# Patient Record
Sex: Female | Born: 1965 | Race: Black or African American | Hispanic: No | Smoking: Current some day smoker
Health system: Southern US, Community
[De-identification: ages and names within clinical notes are randomized; demographics above are authoritative.]

## PROBLEM LIST (undated history)

## (undated) HISTORY — PX: NO PAST SURGERIES: SHX2092

---

## 2016-10-28 ENCOUNTER — Ambulatory Visit
Admission: EM | Admit: 2016-10-28 | Discharge: 2016-10-28 | Disposition: A | Discharge status: Home / Self Care | Payer: 59 | Attending: Emergency Medicine | Admitting: Emergency Medicine

## 2016-10-28 DIAGNOSIS — B029 Zoster without complications: Secondary | ICD-10-CM

## 2016-10-28 MED ORDER — VALACYCLOVIR HCL 1 G PO TABS: 1000.0000 mg | ORAL_TABLET | Freq: Three times a day (TID) | ORAL | 0 refills | Status: AC

## 2016-10-28 MED ORDER — PREDNISONE 10 MG PO TABS: ORAL_TABLET | ORAL | 0 refills | Status: DC

## 2016-10-28 NOTE — ED Triage Notes (Signed)
Patient complains of rash that started Friday on abdomen. Patient states that the area has been stinging and itching.

## 2016-10-28 NOTE — ED Provider Notes (Signed)
[facility] ____________________________________________  Time seen: Approximately 8:51 PM  I have reviewed the triage vital signs and the nursing notes.   HISTORY  Chief Complaint Rash   HPI Wisdom Seybold is a 51 y.o. female presenting for complaints of rash to abdomen that has been present for 2.5 days. Patient reports did notice a slight stinging sensation to same area the day before rash onset and then rash appeared. Patient describes rash as a mild stinging and burning area occasionally itchy and tender. Denies persistent pain. Denies known trigger. Denies insect bite, tick bite or tick attachment. Denies cut, trauma or known break in skin. Denies any other rash or skin changes. Denies fevers. Denies recent sickness. Reports otherwise feels well. Denies history of similar in the past. Reports did have chickenpox as a child. Has not received the shingles vaccine.  Denies chest pain, shortness of breath, abdominal pain, dysuria, sore throat, cough, congestion, fevers, extremity pain, extremity swelling or rash. Denies recent sickness. Denies recent antibiotic use. Denies cardiac history. Denies renal insufficiency history. Denies diabetes.   History reviewed. No pertinent past medical history. Denies There are no active problems to display for this patient.   Past Surgical History:  Procedure Laterality Date  . NO PAST SURGERIES       No current facility-administered medications for this encounter.   Current Outpatient Prescriptions:  .  predniSONE (DELTASONE) 10 MG tablet, Start 60 mg po day one, then 50 mg po day two, taper by 10 mg daily until complete., Disp: 21 tablet, Rfl: 0 .  valACYclovir (VALTREX) 1000 MG tablet, Take 1 tablet (1,000 mg total) by mouth 3 (three) times daily., Disp: 21 tablet, Rfl: 0  Allergies Patient has no known allergies.   family history Reports father had shingles on his chest many years ago.  Social History Social History    Substance Use Topics  . Smoking status: Current Some Day Smoker  . Smokeless tobacco: Never Used  . Alcohol use Yes     Comment: occasionally    Review of Systems Constitutional: No fever/chills Eyes: No visual changes. ENT: No sore throat. Cardiovascular: Denies chest pain. Respiratory: Denies shortness of breath. Gastrointestinal: No abdominal pain.  No nausea, no vomiting.  No diarrhea.  No constipation. Genitourinary: Negative for dysuria. Musculoskeletal: Negative for back pain. Skin: Positive for rash. Neurological: Negative for headaches, focal weakness or numbness.  10-point ROS otherwise negative.  ____________________________________________   PHYSICAL EXAM:  VITAL SIGNS: ED Triage Vitals  Enc Vitals Group     BP 10/28/16 2000 132/76     Pulse Rate 10/28/16 2000 76     Resp 10/28/16 2000 18     Temp 10/28/16 2000 97.6 F (36.4 C)     Temp Source 10/28/16 2000 Oral     SpO2 10/28/16 2000 100 %     Weight 10/28/16 1958 130 lb (59 kg)     Height 10/28/16 1958 5\' 5"  (1.651 m)     Head Circumference --      Peak Flow --      Pain Score 10/28/16 1959 2     Pain Loc --      Pain Edu? --      Excl. in GC? --     Constitutional: Alert and oriented. Well appearing and in no acute distress. Eyes: Conjunctivae are normal. PERRL. EOMI. ENT      Head: Normocephalic and atraumatic.      Nose: No congestion/rhinnorhea.      Mouth/Throat:  Mucous membranes are moist.Oropharynx non-erythematous. Hematological/Lymphatic/Immunilogical: No cervical lymphadenopathy. Cardiovascular: Normal rate, regular rhythm. Grossly normal heart sounds.  Good peripheral circulation. Respiratory: Normal respiratory effort without tachypnea nor retractions. Breath sounds are clear and equal bilaterally. No wheezes, rales, rhonchi. Gastrointestinal: Soft and nontender.  Musculoskeletal:   No midline cervical, thoracic or lumbar tenderness to palpation.  Neurologic:  Normal speech and  language. Speech is normal. No gait instability.  Skin:  Skin is warm, dry and intact.  Except: Right sided suprapubic to right lower abdominal area clustered area of slightly vesicular lesions with erythematous bases, nontender to direct palpation, and no surrounding erythema, no fluctuance, no drainage, no induration. No other rash noted. Psychiatric: Mood and affect are normal. Speech and behavior are normal. Patient exhibits appropriate insight and judgment   ___________________________________________   LABS (all labs ordered are listed, but only abnormal results are displayed)  Labs Reviewed - No data to display ____________________________________________  PROCEDURES Procedures    INITIAL IMPRESSION / ASSESSMENT AND PLAN / ED COURSE  Pertinent labs & imaging results that were available during my care of the patient were reviewed by me and considered in my medical decision making (see chart for details).  Well-appearing patient. No acute distress. Discussed in detail with patient rash clinical appearance suspicious of shingles rash. Discussed evaluation, treatment as well as transmission in detail with patient and her father at bedside. Encouraged good skin care and monitoring. Discussed the patient will treat with oral valacyclovir and prednisone. Patient denies need for pain medication. Discussed close monitoring. 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 Work note given for today and tomorrow.Discussed indication, risks and benefits of medications with patient.  Discussed follow up with Primary care physician this week. Discussed follow up and return parameters including no resolution or any worsening concerns. Patient verbalized understanding and agreed to plan.   ____________________________________________   FINAL CLINICAL IMPRESSION(S) / ED DIAGNOSES  Final diagnoses:  Herpes zoster without complication     Discharge Medication  List as of 10/28/2016  9:10 PM    START taking these medications   Details  predniSONE (DELTASONE) 10 MG tablet Start 60 mg po day one, then 50 mg po day two, taper by 10 mg daily until complete., Normal    valACYclovir (VALTREX) 1000 MG tablet Take 1 tablet (1,000 mg total) by mouth 3 (three) times daily., Starting Mon 10/28/2016, Until Mon 11/04/2016, Normal        Note: This dictation was prepared with Dragon dictation along with smaller phrase technology. Any transcriptional errors that result from this process are unintentional.         Renford DillsLindsey Corita Allinson, NP 10/28/16 2314

## 2016-10-28 NOTE — Discharge Instructions (Signed)
Take medication as prescribed. Rest. Drink plenty of fluids.  ° °Follow up with your primary care physician this week as needed. Return to Urgent care for new or worsening concerns.  ° °

## 2018-10-17 ENCOUNTER — Ambulatory Visit
Admission: EM | Admit: 2018-10-17 | Discharge: 2018-10-17 | Disposition: A | Discharge status: Home / Self Care | Payer: 59 | Attending: Family Medicine | Admitting: Family Medicine

## 2018-10-17 ENCOUNTER — Ambulatory Visit (INDEPENDENT_AMBULATORY_CARE_PROVIDER_SITE_OTHER): Payer: 59

## 2018-10-17 ENCOUNTER — Other Ambulatory Visit: Payer: Self-pay

## 2018-10-17 ENCOUNTER — Encounter: Payer: Self-pay | Admitting: Gynecology

## 2018-10-17 DIAGNOSIS — M25461 Effusion, right knee: Secondary | ICD-10-CM

## 2018-10-17 DIAGNOSIS — M25561 Pain in right knee: Secondary | ICD-10-CM | POA: Diagnosis not present

## 2018-10-17 MED ORDER — MELOXICAM 15 MG PO TABS: 15.0000 mg | ORAL_TABLET | Freq: Every day | ORAL | 0 refills | Status: DC | PRN

## 2018-10-17 NOTE — ED Provider Notes (Signed)
[facility]    CSN: [id number] Arrival date & time: 10/17/18  1517   History   Chief Complaint Right knee pain  HPI  53 year old female presents with right knee pain.  Patient reports a 2 to 3-week history of right knee pain.  No recent fall, trauma, injury.  Pain is located predominantly posteriorly is also located anteriorly.  She reports associated swelling.  No medications or interventions tried.  She is able to ambulate.  She does report difficulty doing so at times.  Moderate in severity.  No relieving factors.  No other associated symptoms.  No other complaints.  PMH, Surgical Hx, Family Hx, Social History reviewed and updated as below.  PMH: Hx of shingles  Past Surgical History:  Procedure Laterality Date  . NO PAST SURGERIES     OB History   No obstetric history on file.    Home Medications    Prior to Admission medications   Medication Sig Start Date End Date Taking? Authorizing Provider  meloxicam (MOBIC) 15 MG tablet Take 1 tablet (15 mg total) by mouth daily as needed for pain. 10/17/18   Tommie Sams, DO   Social History Social History   Tobacco Use  . Smoking status: Current Some Day Smoker    Packs/day: 0.50    Types: Cigarettes  . Smokeless tobacco: Never Used  Substance Use Topics  . Alcohol use: Yes    Comment: occasionally  . Drug use: No     Allergies   Patient has no known allergies.   Review of Systems Review of Systems  Constitutional: Negative.   Musculoskeletal:       Right knee pain, swelling.   Physical Exam Triage Vital Signs ED Triage Vitals  Enc Vitals Group     BP 10/17/18 0851 125/82     Pulse Rate 10/17/18 0851 80     Resp 10/17/18 0851 16     Temp 10/17/18 0851 98.2 F (36.8 C)     Temp src --      SpO2 10/17/18 0851 100 %     Weight 10/17/18 0850 135 lb (61.2 kg)     Height 10/17/18 0850 5\' 5"  (1.651 m)     Head Circumference --      Peak Flow --      Pain Score 10/17/18 0849 0     Pain Loc --       Pain Edu? --      Excl. in GC? --    Updated Vital Signs BP 125/82 (BP Location: Left Arm)   Pulse 80   Temp 98.2 F (36.8 C)   Resp 16   Ht 5\' 5"  (1.651 m)   Wt 61.2 kg   LMP 08/04/2016   SpO2 100%   BMI 22.47 kg/m   Visual Acuity Right Eye Distance:   Left Eye Distance:   Bilateral Distance:    Right Eye Near:   Left Eye Near:    Bilateral Near:     Physical Exam Vitals signs and nursing note reviewed.  Constitutional:      General: She is not in acute distress.    Appearance: Normal appearance.  HENT:     Head: Normocephalic and atraumatic.  Eyes:     General:        Right eye: No discharge.        Left eye: No discharge.     Conjunctiva/sclera: Conjunctivae normal.  Cardiovascular:     Rate and Rhythm: Normal rate  and regular rhythm.  Pulmonary:     Effort: Pulmonary effort is normal.     Breath sounds: Normal breath sounds.  Musculoskeletal:     Comments: Knee: Right Inspection - Mild swelling noted.  Diffusely tender (mild). ROM decreased in extension. Ligaments with solid consistent endpoints including ACL, PCL, LCL, MCL.  Neurological:     Mental Status: She is alert.  Psychiatric:        Mood and Affect: Mood normal.        Behavior: Behavior normal.    UC Treatments / Results  Labs (all labs ordered are listed, but only abnormal results are displayed) Labs Reviewed - No data to display  EKG None  Radiology Dg Knee Complete 4 Views Right  Result Date: 10/17/2018 CLINICAL DATA:  Acute RIGHT knee pain and swelling for 3 weeks. No known injury. Initial encounter. EXAM: RIGHT KNEE - COMPLETE 4+ VIEW COMPARISON:  None. FINDINGS: No acute fracture, subluxation or dislocation. A small knee effusion is present. A 2 cm nonaggressive dense lesion with chondroid matrix is identified in the distal femur, likely an enchondroma. No other bony abnormalities noted. IMPRESSION: Small LEFT knee effusion without acute bony abnormality. Electronically  Signed   By: Harmon Pier M.D.   On: 10/17/2018 09:41    Procedures Procedures (including critical care time)  Medications Ordered in UC Medications - No data to display  Initial Impression / Assessment and Plan / UC Course  I have reviewed the triage vital signs and the nursing notes.  Pertinent labs & imaging results that were available during my care of the patient were reviewed by me and considered in my medical decision making (see chart for details).    53 year old female presents with right knee effusion.  X-ray with small effusion without acute bony abnormality.  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  I have advised her to follow-up with orthopedics.  Starting meloxicam.  Advised rest, ice, elevation.  Final Clinical Impressions(s) / UC Diagnoses   Final diagnoses:  Effusion of right knee     Discharge Instructions     Rest, ice, elevation.  If persists, see Ortho.  Take care  Dr. Adriana Simas     ED Prescriptions    Medication Sig Dispense Auth. Provider   meloxicam (MOBIC) 15 MG tablet Take 1 tablet (15 mg total) by mouth daily as needed for pain. 30 tablet Tommie Sams, DO     Controlled Substance Prescriptions [place] Controlled Substance Registry consulted? Not Applicable   Tommie Sams, DO 10/17/18 1002

## 2018-10-17 NOTE — ED Triage Notes (Signed)
Patient c/o right knee pain/ swelling. Per patient deny any injury to her right knee x yesterday.

## 2018-10-17 NOTE — Discharge Instructions (Signed)
Rest, ice, elevation.  If persists, see Ortho.  Take care  Dr. Adriana Simas

## 2018-10-28 ENCOUNTER — Other Ambulatory Visit: Payer: Self-pay

## 2018-10-28 ENCOUNTER — Encounter: Payer: Self-pay | Admitting: Emergency Medicine

## 2018-10-28 ENCOUNTER — Ambulatory Visit
Admission: EM | Admit: 2018-10-28 | Discharge: 2018-10-28 | Disposition: A | Discharge status: Home / Self Care | Payer: 59 | Attending: Family Medicine | Admitting: Family Medicine

## 2018-10-28 DIAGNOSIS — M25461 Effusion, right knee: Secondary | ICD-10-CM

## 2018-10-28 DIAGNOSIS — M25561 Pain in right knee: Secondary | ICD-10-CM

## 2018-10-28 MED ORDER — ETODOLAC 200 MG PO CAPS: 200.0000 mg | ORAL_CAPSULE | Freq: Three times a day (TID) | ORAL | 0 refills | Status: AC | PRN

## 2018-10-28 NOTE — Discharge Instructions (Signed)
Call [facility] - [phone]  Medication as directed.  Take care  Dr. Adriana Simas

## 2018-10-28 NOTE — ED Provider Notes (Signed)
[facility]    CSN: [id number] Arrival date & time: 10/28/18  0900  History   Chief Complaint Chief Complaint  Patient presents with  . Knee Pain   HPI  53 year old female presents with knee pain.  Patient was recently seen on 3/14.  Patient was treated with meloxicam.  X-ray revealed effusion as well as a 2 cm femur lesion likely an enchondroma.  Patient was advised to follow-up with orthopedics.  Patient presents today reporting continued knee pain.  Slight improvement with meloxicam.  She states that the swelling and pain still persists.  She has not seen orthopedics as instructed.  Once again, no reports of fall, trauma, injury.  No other associated symptoms.  No other complaints.  History reviewed as below. Past Surgical History:  Procedure Laterality Date  . NO PAST SURGERIES     OB History   No obstetric history on file.    Home Medications    Prior to Admission medications   Medication Sig Start Date End Date Taking? Authorizing Provider  etodolac (LODINE) 200 MG capsule Take 1 capsule (200 mg total) by mouth 3 (three) times daily as needed for moderate pain. 10/28/18   Tommie Sams, DO   Social History Social History   Tobacco Use  . Smoking status: Current Some Day Smoker    Packs/day: 0.50    Types: Cigarettes  . Smokeless tobacco: Never Used  Substance Use Topics  . Alcohol use: Yes    Comment: occasionally  . Drug use: No   Allergies   Patient has no known allergies.  Review of Systems Review of Systems  Constitutional: Negative.   Musculoskeletal:       Right knee pain & swelling.   Physical Exam Triage Vital Signs ED Triage Vitals  Enc Vitals Group     BP 10/28/18 0922 128/66     Pulse Rate 10/28/18 0922 83     Resp 10/28/18 0922 18     Temp 10/28/18 0922 97.9 F (36.6 C)     Temp Source 10/28/18 0922 Oral     SpO2 10/28/18 0922 99 %     Weight 10/28/18 0920 154 lb 9.6 oz (70.1 kg)     Height 10/28/18 0920 5\' 5"  (1.651 m)      Head Circumference --      Peak Flow --      Pain Score 10/28/18 0920 5     Pain Loc --      Pain Edu? --      Excl. in GC? --    Updated Vital Signs BP 128/66 (BP Location: Right Arm)   Pulse 83   Temp 97.9 F (36.6 C) (Oral)   Resp 18   Ht 5\' 5"  (1.651 m)   Wt 70.1 kg   LMP 08/04/2016   SpO2 99%   BMI 25.73 kg/m   Visual Acuity Right Eye Distance:   Left Eye Distance:   Bilateral Distance:    Right Eye Near:   Left Eye Near:    Bilateral Near:     Physical Exam Vitals signs and nursing note reviewed.  Constitutional:      General: She is not in acute distress.    Appearance: Normal appearance.  HENT:     Head: Normocephalic and atraumatic.  Eyes:     General:        Right eye: No discharge.        Left eye: No discharge.     Conjunctiva/sclera: Conjunctivae  normal.  Pulmonary:     Effort: Pulmonary effort is normal. No respiratory distress.  Musculoskeletal:     Comments: Right knee - Effusion noted.  No discrete areas of tenderness.  Ligaments intact.  Neurological:     Mental Status: She is alert.  Psychiatric:        Mood and Affect: Mood normal.        Behavior: Behavior normal.    UC Treatments / Results  Labs (all labs ordered are listed, but only abnormal results are displayed) Labs Reviewed - No data to display  EKG None  Radiology No results found.  Procedures Procedures (including critical care time)  Medications Ordered in UC Medications - No data to display  Initial Impression / Assessment and Plan / UC Course  I have reviewed the triage vital signs and the nursing notes.  Pertinent labs & imaging results that were available during my care of the patient were reviewed by me and considered in my medical decision making (see chart for details).    53 year old female presents with knee effusion/right knee pain.  Advised to see [facility].  Stopping meloxicam.  Starting etodolac.  Final Clinical Impressions(s) / UC Diagnoses    Final diagnoses:  Effusion of right knee  Right knee pain, unspecified chronicity     Discharge Instructions     Call [facility] - [phone]  Medication as directed.  Take care  Dr. Adriana Simas    ED Prescriptions    Medication Sig Dispense Auth. Provider   etodolac (LODINE) 200 MG capsule Take 1 capsule (200 mg total) by mouth 3 (three) times daily as needed for moderate pain. 30 capsule Tommie Sams, DO     Controlled Substance Prescriptions [place] Controlled Substance Registry consulted? Not Applicable   Tommie Sams, [place] 10/28/18 [phone]

## 2018-10-28 NOTE — ED Triage Notes (Signed)
Patient c/o right knee pain and swelling. She is requesting something stronger for pain. She stated she hasn't followed up with orthopedics yet because she did not have the images. Images have been requested and will give to patient prior to her leaving today.

## 2020-02-16 IMAGING — CR RIGHT KNEE - COMPLETE 4+ VIEW
4 series · 4 of 4 positions shown · non-contrast
Comparison: None.

CLINICAL DATA: Acute RIGHT knee pain and swelling for 3 weeks. No
known injury. Initial encounter.

EXAM:
RIGHT KNEE - COMPLETE 4+ VIEW

[knee lat]
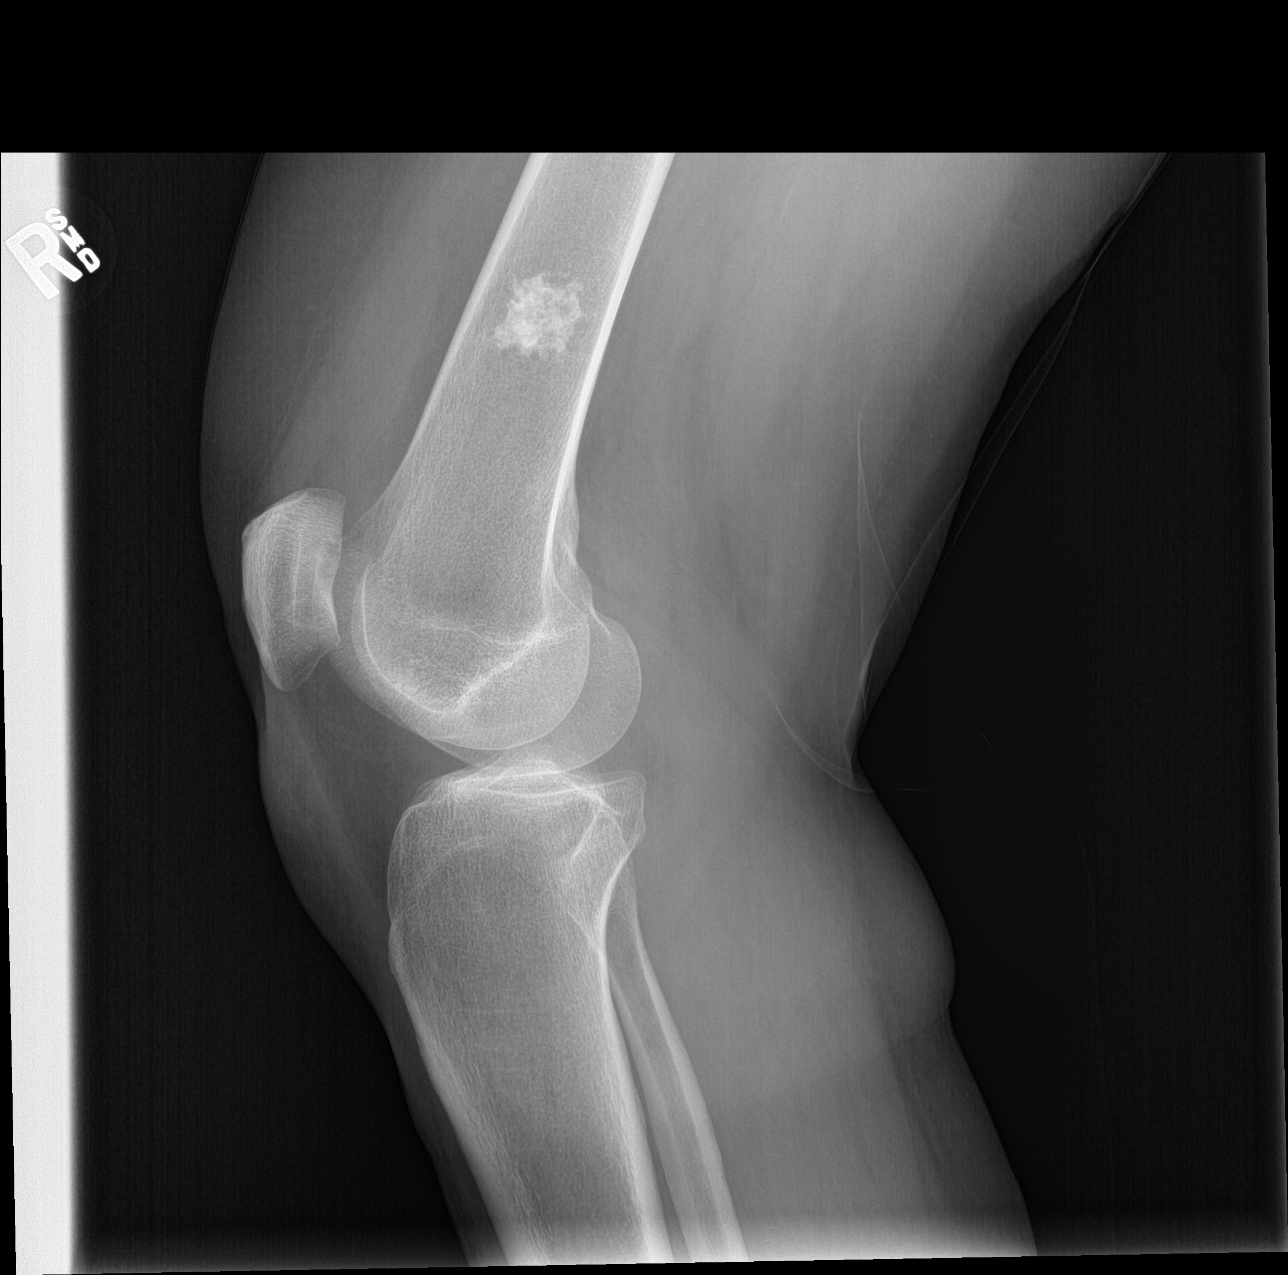

[tunnel]
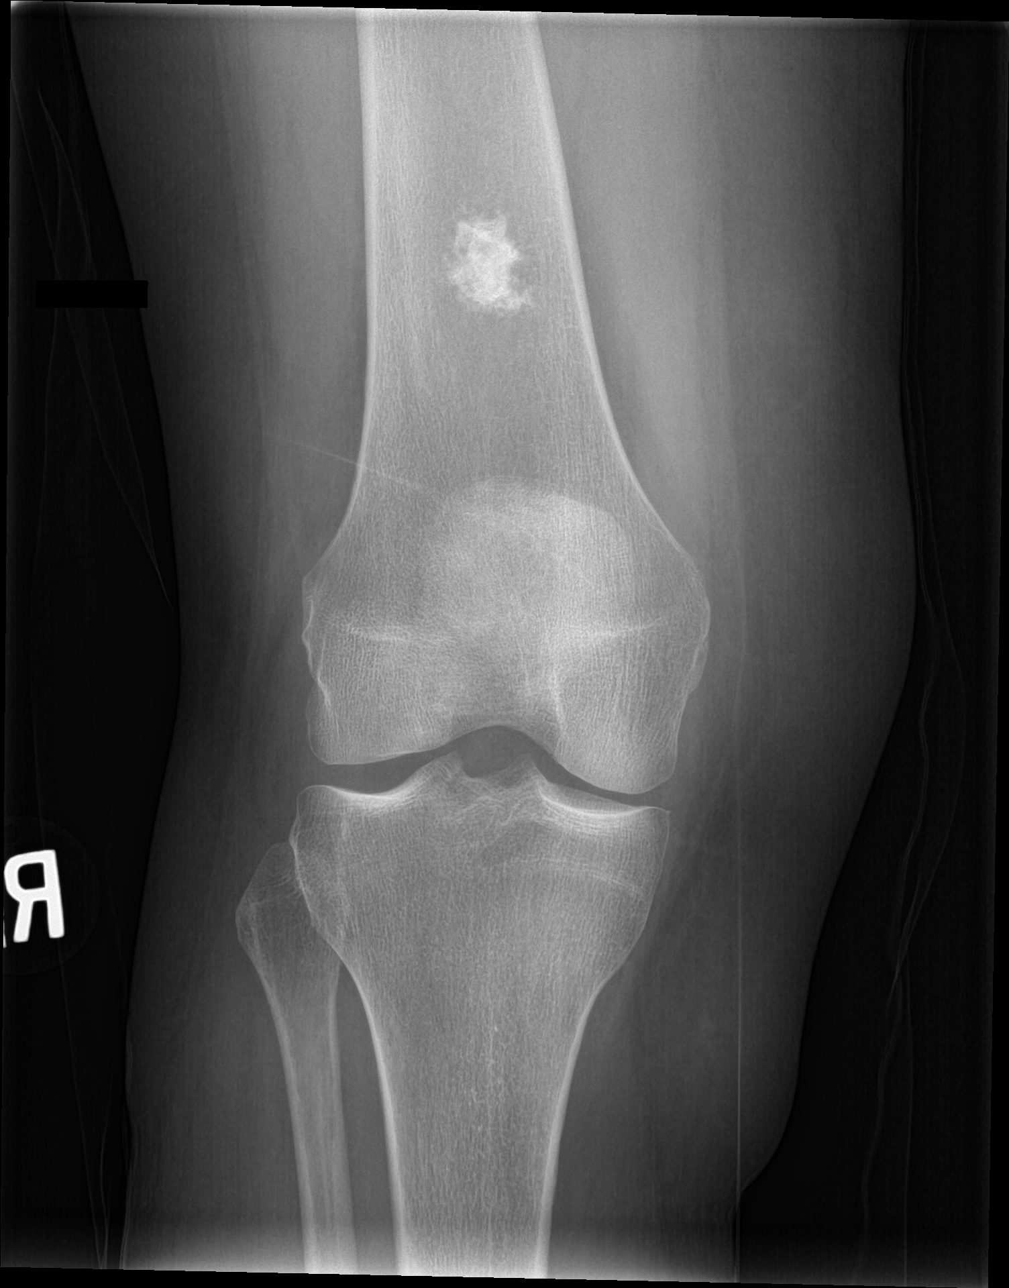

[patella skyline]
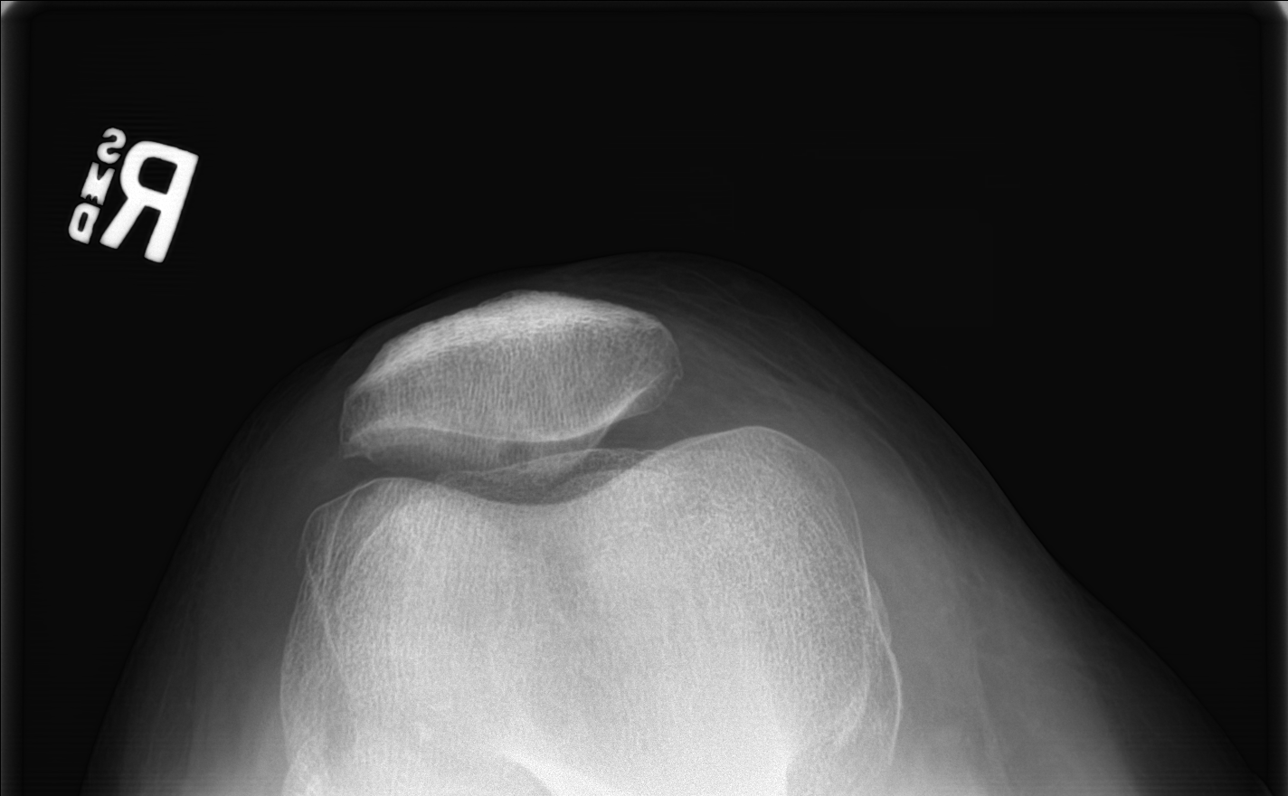

[knee ap]
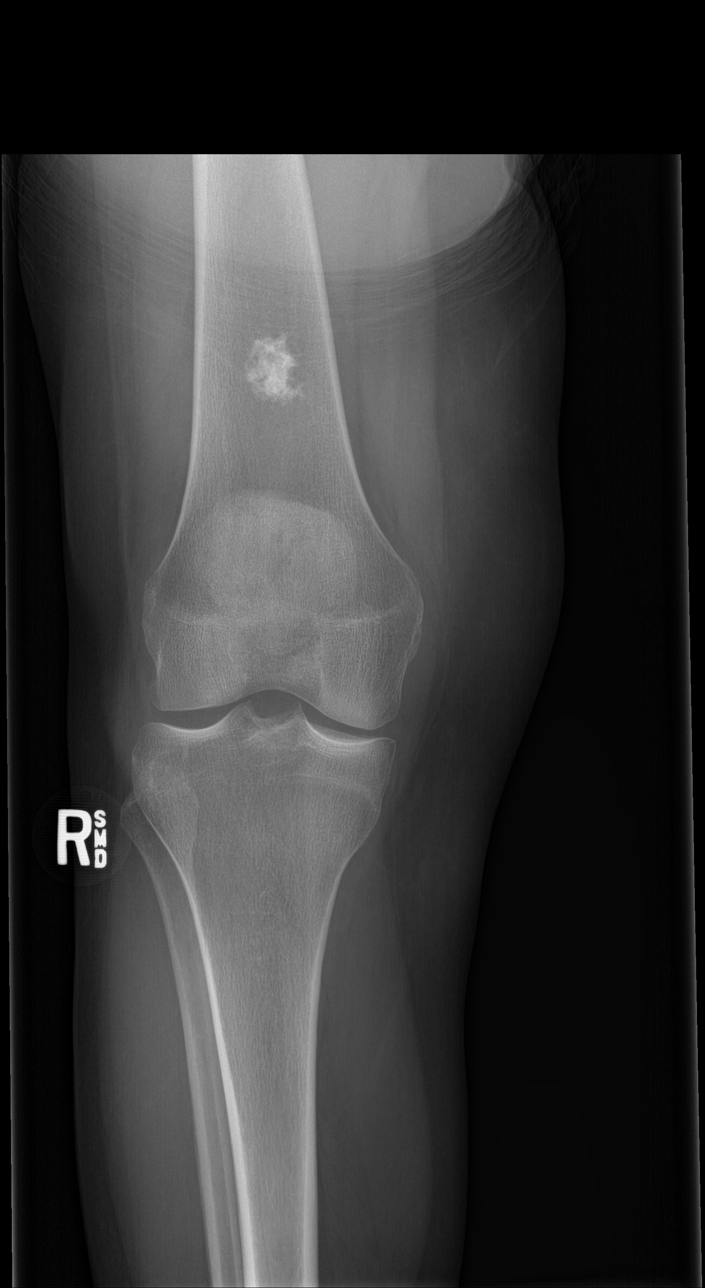

[4 of 4 positions shown; findings below may reference images not displayed]

FINDINGS: No acute fracture, subluxation or dislocation.

A small knee effusion is present.

A 2 cm nonaggressive dense lesion with chondroid matrix is
identified in the distal femur, likely an enchondroma.

No other bony abnormalities noted.
IMPRESSION: Small LEFT knee effusion without acute bony abnormality.
